# Patient Record
Sex: Male | Born: 2000 | Race: White | Hispanic: No | Marital: Single | Smoking: Never smoker
Health system: Southern US, Community
[De-identification: ages and names within clinical notes are randomized; demographics above are authoritative.]

## PROBLEM LIST (undated history)

## (undated) DIAGNOSIS — J45909 Unspecified asthma, uncomplicated: Secondary | ICD-10-CM

---
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
## 2021-04-14 ENCOUNTER — Other Ambulatory Visit: Payer: Self-pay

## 2021-04-14 ENCOUNTER — Ambulatory Visit (INDEPENDENT_AMBULATORY_CARE_PROVIDER_SITE_OTHER): Payer: Medicaid Other

## 2021-04-14 ENCOUNTER — Ambulatory Visit
Admission: EM | Admit: 2021-04-14 | Discharge: 2021-04-14 | Disposition: A | Discharge status: Home / Self Care | Payer: Medicaid Other | Attending: Physician Assistant | Admitting: Physician Assistant

## 2021-04-14 DIAGNOSIS — R079 Chest pain, unspecified: Secondary | ICD-10-CM | POA: Diagnosis not present

## 2021-04-14 DIAGNOSIS — R0789 Other chest pain: Secondary | ICD-10-CM | POA: Diagnosis not present

## 2021-04-14 DIAGNOSIS — J45909 Unspecified asthma, uncomplicated: Secondary | ICD-10-CM

## 2021-04-14 DIAGNOSIS — R0602 Shortness of breath: Secondary | ICD-10-CM

## 2021-04-14 DIAGNOSIS — F419 Anxiety disorder, unspecified: Secondary | ICD-10-CM

## 2021-04-14 MED ORDER — HYDROXYZINE HCL 25 MG PO TABS: 25.0000 mg | ORAL_TABLET | Freq: Four times a day (QID) | ORAL | 1 refills | Status: AC | PRN

## 2021-04-14 MED ORDER — ALBUTEROL SULFATE HFA 108 (90 BASE) MCG/ACT IN AERS: 1.0000 | INHALATION_SPRAY | Freq: Four times a day (QID) | RESPIRATORY_TRACT | 0 refills | Status: DC | PRN

## 2021-04-14 NOTE — ED Provider Notes (Signed)
[facility]    CSN: [id number] Arrival date & time: 04/14/21  0930      History   Chief Complaint Chief Complaint  Patient presents with   Panic Attack    HPI [place] T Nordmeyer is a 20 y.o. male presenting for approximately 1 to 42-month history of intermittent but nearly daily chest tightness and feeling short of breath and like he cannot obtain a good breath.  Patient admits to increased anxiety and stress in his life.  He states that he does not sleep well.  He states that he believes he had a panic attack last night.  He says that he was just sitting down at home with his grandmother relaxing and suddenly felt like his chest was tight and he could not breathe.  He says that he currently feels like his chest is little tight, he cannot obtain a good breath.  Patient states he occasionally wheezes.  He believes he might have asthma but says he has not really been diagnosed with asthma.  He says he did try his grandmothers asthma inhaler but did not seem to help.  Patient does smoke marijuana.  He states that sometimes after he smokes it feels like his throat closes.  Patient says he believes he does have anxiety but has not been formally diagnosed.  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  He says that he is doing well in school.Marland Kitchen  He says he has his own apartment in [place] but he does get lonely.  He says that he has come to [place] to visit his grandmother.  Patient says he does not have a relationship with either of his parents though they live in the area.  He states that there is a long history of mental health problems in his family.  Patient fears that he may also have an undiagnosed mental health problem and need to take medication.  He says that he has had Xanax in the past but it seemed to make him feel worse.  Does admit to feeling like he might be depressed as well.  He has issues with sleep.  He denies any suicidal  ideation.  He has no other concerns.  HPI  Past Medical History:  Diagnosis Date   Asthma     There are no problems to display for this patient.   History reviewed. No pertinent surgical history.     Home Medications    Prior to Admission medications   Medication Sig Start Date End Date Taking? Authorizing Provider  albuterol (VENTOLIN HFA) 108 (90 Base) MCG/ACT inhaler Inhale 1-2 puffs into the lungs every 6 (six) hours as needed for wheezing or shortness of breath. 04/14/21 04/14/22 Yes Eusebio Friendly B, PA-C  hydrOXYzine (ATARAX/VISTARIL) 25 MG tablet Take 1 tablet (25 mg total) by mouth every 6 (six) hours as needed for anxiety. 04/14/21 05/14/21 Yes Eusebio Friendly B, PA-C  bismuth subsalicylate (PEPTO-BISMOL) 262 MG/15ML suspension Take 30 mLs by mouth 3 (three) times daily. With Zantac 150 mg 3 times a day for the next 3-5 days 10/21/15   Hassan Rowan, MD  diphenoxylate-atropine (LOMOTIL) 2.5-0.025 MG tablet Take 1 tablet by mouth 4 (four) times daily as needed for diarrhea or loose stools. 10/21/15   Hassan Rowan, MD  doxycycline (VIBRA-TABS) 100 MG tablet Take 1 tablet (100 mg total) 2 (two) times daily by mouth. 09/03/17   Payton Mccallum, MD  ondansetron (ZOFRAN ODT) 8 MG disintegrating  tablet Take 1 tablet (8 mg total) by mouth every 8 (eight) hours as needed for nausea or vomiting. 10/21/15   Hassan Rowan, MD  ranitidine (ZANTAC) 150 MG capsule Take 1 capsule (150 mg total) by mouth 3 (three) times daily. With Pepto-Bismol for the next 3-5 days 10/21/15   Hassan Rowan, MD    Family History No family history on file.  Social History Social History   Tobacco Use   Smoking status: Never   Smokeless tobacco: Never  Substance Use Topics   Alcohol use: No   Drug use: Yes    Types: Marijuana     Allergies   Patient has no known allergies.   Review of Systems Review of Systems  Constitutional:  Negative for fatigue.  HENT:  Negative for congestion.   Respiratory:   Positive for chest tightness, shortness of breath and wheezing. Negative for cough.   Cardiovascular:  Negative for palpitations.  Gastrointestinal:  Negative for abdominal pain and vomiting.  Neurological:  Negative for dizziness and headaches.  Psychiatric/Behavioral:  Positive for sleep disturbance. Negative for suicidal ideas. The patient is nervous/anxious.     Physical Exam Triage Vital Signs ED Triage Vitals  Enc Vitals Group     BP 04/14/21 0936 (!) 149/83     Pulse Rate 04/14/21 0936 93     Resp 04/14/21 0936 16     Temp 04/14/21 0936 98 F (36.7 C)     Temp Source 04/14/21 0936 Oral     SpO2 04/14/21 0936 100 %     Weight 04/14/21 0937 245 lb (111.1 kg)     Height 04/14/21 0937 5\' 7"  (1.702 m)     Head Circumference --      Peak Flow --      Pain Score 04/14/21 0936 0     Pain Loc --      Pain Edu? --      Excl. in [place]? --    No data found.  Updated Vital Signs BP 127/76   Pulse 73   Temp 98 F (36.7 C) (Oral)   Resp 16   Ht 5\' 7"  (1.702 m)   Wt 245 lb (111.1 kg)   SpO2 100%   BMI 38.37 kg/m       Physical Exam Vitals and nursing note reviewed.  Constitutional:      General: He is not in acute distress.    Appearance: Normal appearance. He is well-developed. He is obese. He is not ill-appearing.  HENT:     Head: Normocephalic and atraumatic.  Eyes:     General: No scleral icterus.    Conjunctiva/sclera: Conjunctivae normal.  Cardiovascular:     Rate and Rhythm: Normal rate and regular rhythm.     Heart sounds: Normal heart sounds.  Pulmonary:     Effort: Pulmonary effort is normal. No respiratory distress.     Breath sounds: Wheezing (few scattered wheezes) present. No rhonchi or rales.  Musculoskeletal:     Cervical back: Neck supple.  Skin:    General: Skin is warm and dry.  Neurological:     General: No focal deficit present.     Mental Status: He is alert and oriented to person, place, and time. Mental status is at baseline.     Motor: No  weakness.     Gait: Gait normal.  Psychiatric:        Mood and Affect: Mood is anxious.        Behavior: Behavior normal.  Thought Content: Thought content normal.     UC Treatments / Results  Labs (all labs ordered are listed, but only abnormal results are displayed) Labs Reviewed - No data to display  EKG   Radiology DG Chest 2 View  Result Date: 04/14/2021 CLINICAL DATA:  Shortness of breath with chest tightness EXAM: CHEST - 2 VIEW COMPARISON:  None FINDINGS: Lungs are clear. Heart size and pulmonary vascularity are normal. No adenopathy. No bone lesions. No pneumothorax. IMPRESSION: Lungs clear.  Cardiac silhouette normal. Electronically Signed   By: Bretta BangWilliam  Woodruff III M.D.   On: 04/14/2021 10:30    Procedures ED EKG  Date/Time: 04/14/2021 10:27 AM Performed by: Shirlee LatchEaves, Tiannah Greenly B, PA-C Authorized by: Shirlee LatchEaves, Kamar Callender B, PA-C   ECG reviewed by ED Physician in the absence of a cardiologist: yes   Previous ECG:    Previous ECG:  Unavailable Interpretation:    Interpretation: normal   Rate:    ECG rate:  72   ECG rate assessment: normal   Rhythm:    Rhythm: sinus rhythm   Ectopy:    Ectopy: none   QRS:    QRS axis:  Normal   QRS intervals:  Normal   QRS conduction: normal   ST segments:    ST segments:  Normal T waves:    T waves: normal   Comments:     Normal sinus rhythm with sinus arrhythmia (including critical care time)  Medications Ordered in UC Medications - No data to display  Initial Impression / Assessment and Plan / UC Course  I have reviewed the triage vital signs and the nursing notes.  Pertinent labs & imaging results that were available during my care of the patient were reviewed by me and considered in my medical decision making (see chart for details).  20 year old male presenting for concerns about anxiety/panic attack.  He says that over the past couple of months he has had intermittent chest tightness and feeling short of breath  admits to some racing thoughts and difficulty sleeping at night.  Patient does report that he does not have a good relationship with his parents or much of his family.  He is also attending school in [place] and living on his own and admits to feeling lonely.  Strong family history of mental health conditions.  Vital signs are all normal and stable.  He is overall well-appearing.  He does seem somewhat anxious and repeatedly states "I don't know" to a lot of my questions.  Auscultation of heart reveals regular rate and rhythm.  There are a few scattered wheezes throughout chest.  No respiratory distress.  Ordered EKG and chest x-ray.  EKG read by me shows normal sinus rhythm with sinus arrhythmia.  Chest x-ray independently viewed by me is normal.  Overread normal  Advised patient that he may have some underlying asthma or reactive airway disease.  Prescribed inhaler for him to use if he feels short of breath and encouraged him to try to stop smoking marijuana.  Advised that this is likely something that helps his anxiety and stress and if he does stop that and he definitely needs to find another outlet.  Suggested that he talk to the student health center at [employer].  There are many resources there.  He may need to be on an SSRI for anxiety and depression and speak with a therapist.  I have also provided him with contact information for [facility] in [place] since he says that he does  visit his grandmother often and could return to the area.  At this time, advised that he can try the hydroxyzine if he feels like he is having a panic attack.  Advised to go to ED for any acute or severe symptoms such as panic attacks that is not getting better, shortness of breath not resolved by rest and the albuterol inhaler, worsening chest pain, feeling faint or passing out, or if he had any suicidal thoughts or plans of self-harm.  Patient agreeable to plan.  > 60 min spent in direct patient  care  Final Clinical Impressions(s) / UC Diagnoses   Final diagnoses:  Anxiety  Feeling of chest tightness  Shortness of breath  Reactive airway disease without complication, unspecified asthma severity, unspecified whether persistent     Discharge Instructions      -EKG is normal -Chest xray is also normal -Vital signs are normal -Mild wheezing on exam so you probably do have underlying asthma or reactive airway disease. Use albuterol and try to stop smoking. Follow up with PCP. If you don't have one, the [facility] health center has many resources and you should be able to easily make an appointment online. This also goes for making appointment about your anxiety. We can try hydroxyzine at this time if you feel you are having a panic attack, but you may needs something like Zoloft or Prozac to help the anxiety and depression. This is something you take everyday. Additionally you would benefit from speaking to a therapist. Your health center on campus should have information about this as well -I have included contact info for [facility]. You can call them for appointment as well -Try Calm or Headspace apps -Please go to ED for worsening chest pain or breathing, feeling weak or passing out, or if you have thoughts of harming yourself or someone else.     ED Prescriptions     Medication Sig Dispense Auth. Provider   hydrOXYzine (ATARAX/VISTARIL) 25 MG tablet Take 1 tablet (25 mg total) by mouth every 6 (six) hours as needed for anxiety. 60 tablet Eusebio Friendly B, PA-C   albuterol (VENTOLIN HFA) 108 (90 Base) MCG/ACT inhaler Inhale 1-2 puffs into the lungs every 6 (six) hours as needed for wheezing or shortness of breath. 1 g Shirlee Latch, PA-C      PDMP not reviewed this encounter.   Shirlee Latch, PA-C 04/14/21 1051

## 2021-04-14 NOTE — ED Triage Notes (Signed)
Pt sts he had a panic attack last night. Sts when he woke up this morning he felt SOB.  NAD in triage.

## 2021-04-14 NOTE — Discharge Instructions (Addendum)
-  EKG is normal -Chest xray is also normal -Vital signs are normal -Mild wheezing on exam so you probably do have underlying asthma or reactive airway disease. Use albuterol and try to stop smoking. Follow up with PCP. If you don't have one, the [facility] center has many resources and you should be able to easily make an appointment online. This also goes for making appointment about your anxiety. We can try hydroxyzine at this time if you feel you are having a panic attack, but you may needs something like Zoloft or Prozac to help the anxiety and depression. This is something you take everyday. Additionally you would benefit from speaking to a therapist. Your health center on campus should have information about this as well -I have included contact info for [facility]. You can call them for appointment as well -Try Calm or Headspace apps -Please go to ED for worsening chest pain or breathing, feeling weak or passing out, or if you have thoughts of harming yourself or someone else.

## 2021-04-18 ENCOUNTER — Telehealth: Payer: Self-pay

## 2021-05-27 ENCOUNTER — Ambulatory Visit
Admission: EM | Admit: 2021-05-27 | Discharge: 2021-05-27 | Disposition: A | Discharge status: Home / Self Care | Payer: Medicaid Other | Attending: Family Medicine | Admitting: Family Medicine

## 2021-05-27 ENCOUNTER — Telehealth: Payer: Self-pay

## 2021-05-27 ENCOUNTER — Other Ambulatory Visit: Payer: Self-pay

## 2021-05-27 DIAGNOSIS — U071 COVID-19: Secondary | ICD-10-CM | POA: Insufficient documentation

## 2021-05-27 LAB — BASIC METABOLIC PANEL
Anion gap: 11 (ref 5–15)
BUN: 12 mg/dL (ref 6–20)
CO2: 22 mmol/L (ref 22–32)
Calcium: 9.5 mg/dL (ref 8.9–10.3)
Chloride: 101 mmol/L (ref 98–111)
Creatinine, Ser: 1.01 mg/dL (ref 0.61–1.24)
GFR, Estimated: >60 mL/min (ref >60)
Glucose, Bld: 93 mg/dL (ref 70–99)
Potassium: 3.5 mmol/L (ref 3.5–5.1)
Sodium: 134 mmol/L — ABNORMAL LOW (ref 135–145)

## 2021-05-27 LAB — RESP PANEL BY RT-PCR (FLU A&B, COVID) ARPGX2
Influenza A by PCR: NEGATIVE
Influenza B by PCR: NEGATIVE
SARS Coronavirus 2 by RT PCR: POSITIVE — AB

## 2021-05-27 MED ORDER — ONDANSETRON 8 MG PO TBDP: 8.0000 mg | ORAL_TABLET | Freq: Three times a day (TID) | ORAL | 0 refills | Status: AC | PRN

## 2021-05-27 MED ORDER — PROMETHAZINE-DM 6.25-15 MG/5ML PO SYRP: 5.0000 mL | ORAL_SOLUTION | Freq: Four times a day (QID) | ORAL | 0 refills | Status: AC | PRN

## 2021-05-27 MED ORDER — IPRATROPIUM BROMIDE 0.06 % NA SOLN: 2.0000 | Freq: Four times a day (QID) | NASAL | 12 refills | Status: AC

## 2021-05-27 MED ORDER — ONDANSETRON 8 MG PO TBDP: 8.0000 mg | ORAL_TABLET | Freq: Three times a day (TID) | ORAL | 0 refills | Status: DC | PRN

## 2021-05-27 MED ORDER — BENZONATATE 100 MG PO CAPS: 200.0000 mg | ORAL_CAPSULE | Freq: Three times a day (TID) | ORAL | 0 refills | Status: AC

## 2021-05-27 MED ORDER — NIRMATRELVIR/RITONAVIR (PAXLOVID)TABLET: 3.0000 | ORAL_TABLET | Freq: Two times a day (BID) | ORAL | 0 refills | Status: AC

## 2021-05-27 MED ORDER — ALBUTEROL SULFATE HFA 108 (90 BASE) MCG/ACT IN AERS: 1.0000 | INHALATION_SPRAY | Freq: Four times a day (QID) | RESPIRATORY_TRACT | 1 refills | Status: AC | PRN

## 2021-05-27 NOTE — ED Triage Notes (Signed)
Patient states that he has been having shortness of breath, cough, vomiting, weakness. States that his father tested positive for covid. Patients symptoms started 12 hours ago.

## 2021-05-27 NOTE — ED Provider Notes (Signed)
[facility]    CSN: [id number] Arrival date & time: 05/27/21  0569      History   Chief Complaint Chief Complaint  Patient presents with   Shortness of Breath    HPI Joshua Newton is a 20 y.o. male.   HPI  20 year old male here for evaluation of respiratory complaints.  Patient reports that 12 hours ago he developed runny nose and nasal congestion, burning sore throat, nausea and vomiting for 12 episodes of emesis, diarrhea, shortness of breath and wheezing, abdominal cramping, and body aches.  He denies any pain in his ears.  Patient's father did test positive for COVID.  Patient reports that he has asthma and that he also smokes marijuana.  He does have a rescue inhaler which he had to use once last night.   Past Medical History:  Diagnosis Date   Asthma     There are no problems to display for this patient.   History reviewed. No pertinent surgical history.     Home Medications    Prior to Admission medications   Medication Sig Start Date End Date Taking? Authorizing Provider  benzonatate (TESSALON) 100 MG capsule Take 2 capsules (200 mg total) by mouth every 8 (eight) hours. 05/27/21  Yes Margarette Canada, NP  ipratropium (ATROVENT) 0.06 % nasal spray Place 2 sprays into both nostrils 4 (four) times daily. 05/27/21  Yes Margarette Canada, NP  nirmatrelvir/ritonavir EUA (PAXLOVID) TABS Take 3 tablets by mouth 2 (two) times daily for 5 days. Patient GFR is >60. Take nirmatrelvir (150 mg) two tablets twice daily for 5 days and ritonavir (100 mg) one tablet twice daily for 5 days. 05/27/21 06/01/21 Yes Margarette Canada, NP  promethazine-dextromethorphan (PROMETHAZINE-DM) 6.25-15 MG/5ML syrup Take 5 mLs by mouth 4 (four) times daily as needed. 05/27/21  Yes Margarette Canada, NP  albuterol (VENTOLIN HFA) 108 (90 Base) MCG/ACT inhaler Inhale 1-2 puffs into the lungs every 6 (six) hours as needed for wheezing or shortness of breath. 05/27/21 05/27/22  Margarette Canada, NP  ondansetron  (ZOFRAN ODT) 8 MG disintegrating tablet Take 1 tablet (8 mg total) by mouth every 8 (eight) hours as needed for nausea or vomiting. 05/27/21   Margarette Canada, NP    Family History History reviewed. No pertinent family history.  Social History Social History   Tobacco Use   Smoking status: Never   Smokeless tobacco: Never  Substance Use Topics   Alcohol use: No   Drug use: Yes    Types: Marijuana     Allergies   Patient has no known allergies.   Review of Systems Review of Systems  Constitutional:  Positive for fatigue and fever. Negative for activity change and appetite change.  HENT:  Positive for congestion, rhinorrhea and sore throat. Negative for ear pain.   Respiratory:  Positive for cough, shortness of breath and wheezing.   Gastrointestinal:  Positive for abdominal pain, diarrhea, nausea and vomiting.  Musculoskeletal:  Positive for arthralgias and myalgias.  Skin:  Negative for rash.  Hematological: Negative.   Psychiatric/Behavioral: Negative.      Physical Exam Triage Vital Signs ED Triage Vitals  Enc Vitals Group     BP 05/27/21 0833 (!) 143/85     Pulse Rate 05/27/21 0833 (!) 112     Resp 05/27/21 0833 18     Temp 05/27/21 0833 (!) 100.8 F (38.2 C)     Temp Source 05/27/21 0833 Oral     SpO2 05/27/21 0833 98 %  Weight 05/27/21 0831 240 lb (108.9 kg)     Height 05/27/21 0831 _0  (1.702 m)     Head Circumference --      Peak Flow --      Pain Score 05/27/21 0831 4     Pain Loc --      Pain Edu? --      Excl. in [place]? --    No data found.  Updated Vital Signs BP (!) 143/85 (BP Location: Right Arm)   Pulse (!) 112   Temp (!) 100.8 F (38.2 C) (Oral)   Resp 18   Ht _1  (1.702 m)   Wt 240 lb (108.9 kg)   SpO2 98%   BMI 37.59 kg/m   Visual Acuity Right Eye Distance:   Left Eye Distance:   Bilateral Distance:    Right Eye Near:   Left Eye Near:    Bilateral Near:     Physical Exam Vitals and nursing note reviewed.  Constitutional:       General: He is not in acute distress.    Appearance: Normal appearance. He is obese. He is not ill-appearing.  HENT:     Head: Normocephalic and atraumatic.     Right Ear: Tympanic membrane, ear canal and external ear normal. There is no impacted cerumen.     Left Ear: Tympanic membrane, ear canal and external ear normal. There is no impacted cerumen.     Nose: Congestion and rhinorrhea present.     Mouth/Throat:     Mouth: Mucous membranes are moist.     Pharynx: Oropharynx is clear. Posterior oropharyngeal erythema present.  Cardiovascular:     Rate and Rhythm: Normal rate and regular rhythm.     Pulses: Normal pulses.     Heart sounds: Normal heart sounds. No murmur heard.   No gallop.  Pulmonary:     Effort: Pulmonary effort is normal.     Breath sounds: Normal breath sounds. No wheezing, rhonchi or rales.  Abdominal:     Palpations: Abdomen is soft.     Tenderness: There is no abdominal tenderness.  Musculoskeletal:     Cervical back: Normal range of motion and neck supple.  Lymphadenopathy:     Cervical: No cervical adenopathy.  Skin:    General: Skin is warm and dry.     Capillary Refill: Capillary refill takes less than 2 seconds.     Findings: No erythema or rash.  Neurological:     General: No focal deficit present.     Mental Status: He is alert and oriented to person, place, and time.  Psychiatric:        Mood and Affect: Mood normal.        Behavior: Behavior normal.        Thought Content: Thought content normal.        Judgment: Judgment normal.     UC Treatments / Results  Labs (all labs ordered are listed, but only abnormal results are displayed) Labs Reviewed  RESP PANEL BY RT-PCR (FLU A&B, COVID) ARPGX2 - Abnormal; Notable for the following components:      Result Value   SARS Coronavirus 2 by RT PCR POSITIVE (*)    All other components within normal limits  BASIC METABOLIC PANEL - Abnormal; Notable for the following components:   Sodium 134  (*)    All other components within normal limits    EKG   Radiology No results found.  Procedures Procedures (including critical care time)  Medications Ordered in UC Medications - No data to display  Initial Impression / Assessment and Plan / UC Course  I have reviewed the triage vital signs and the nursing notes.  Pertinent labs & imaging results that were available during my care of the patient were reviewed by me and considered in my medical decision making (see chart for details).  Patient is a nontoxic-appearing 20 year old male here for evaluation following COVID exposure COVID-like symptoms.  He is complaining of upper respiratory symptoms with runny nose nasal congestion, burning in his throat he believes is secondary to his vomiting, and lower respiratory symptoms as well.  Patient is complaining of nonproductive cough, shortness of breath and wheezing.  Patient is a smoker and he also has asthma.  Patient is not tachypneic nor hypoxic.  He is febrile in the clinic with a temperature of 100.8.  Patient's physical exam reveals tympanic membrane's bilaterally with a normal light reflex and clear external auditory canals.  Nasal mucosa is erythematous and edematous with out nasal discharge.  Oropharyngeal exam reveals mild posterior oropharyngeal erythema.  No cervical adenopathy appreciated exam.  Cardiopulmonary exam reveals clear lung sounds in all fields.  Patient swabbed for COVID and flu with a respiratory triplex panel at triage.  Awaiting results.  Respiratory triplex panel is positive for COVID.  Will draw been met to check GFR to see if it is within parameters to prescribe Paxlovid.  BMP shows a GFR greater than 60.  Will discharge patient home on Paxlovid, Atrovent nasal spray, Tessalon Perles, and Promethazine DM cough syrup.  ER precautions and quarantine instructions reviewed with patient.  I will also renew the patient's prescription for albuterol and for Zofran as he  has had nausea and vomiting associated with his symptoms.   Final Clinical Impressions(s) / UC Diagnoses   Final diagnoses:  [id number]     Discharge Instructions      You will have to quarantine for 5 days from the start of your symptoms.  After 5 days you can break quarantine if your symptoms have improved and you have not had a fever for 24 hours without taking Tylenol or ibuprofen.  Use over-the-counter Tylenol and ibuprofen as needed for body aches and fever.  Take the Paxlovid twice daily for 5 days for treatment of COVID-19.  Use your albuterol inhaler, 2 puffs every 4-6 hours, as needed for shortness of breath and wheezing.  Use the Tessalon Perles during the day as needed for cough and the Promethazine DM cough syrup at nighttime as will make you drowsy.  Use the Zofran every 8 hours as needed for nausea and vomiting.  If you develop any increased shortness of breath-especially at rest, you are unable to speak in full sentences, or is a late sign your lips are turning blue you need to go the ER for evaluation.      ED Prescriptions     Medication Sig Dispense Auth. Provider   albuterol (VENTOLIN HFA) 108 (90 Base) MCG/ACT inhaler Inhale 1-2 puffs into the lungs every 6 (six) hours as needed for wheezing or shortness of breath. 18 g Margarette Canada, NP   ondansetron (ZOFRAN ODT) 8 MG disintegrating tablet Take 1 tablet (8 mg total) by mouth every 8 (eight) hours as needed for nausea or vomiting. 20 tablet Margarette Canada, NP   benzonatate (TESSALON) 100 MG capsule Take 2 capsules (200 mg total) by mouth every 8 (eight) hours. 21 capsule Margarette Canada, NP   ipratropium (ATROVENT) 0.06 %  nasal spray Place 2 sprays into both nostrils 4 (four) times daily. 15 mL Margarette Canada, NP   nirmatrelvir/ritonavir EUA (PAXLOVID) TABS Take 3 tablets by mouth 2 (two) times daily for 5 days. Patient GFR is >60. Take nirmatrelvir (150 mg) two tablets twice daily for 5 days and ritonavir (100 mg) one  tablet twice daily for 5 days. 30 tablet Margarette Canada, NP   promethazine-dextromethorphan (PROMETHAZINE-DM) 6.25-15 MG/5ML syrup Take 5 mLs by mouth 4 (four) times daily as needed. 118 mL Margarette Canada, NP      PDMP not reviewed this encounter.   Margarette Canada, NP 05/27/21 1035

## 2021-05-27 NOTE — Discharge Instructions (Addendum)
You will have to quarantine for 5 days from the start of your symptoms.  After 5 days you can break quarantine if your symptoms have improved and you have not had a fever for 24 hours without taking Tylenol or ibuprofen.  Use over-the-counter Tylenol and ibuprofen as needed for body aches and fever.  Take the Paxlovid twice daily for 5 days for treatment of COVID-19.  Use your albuterol inhaler, 2 puffs every 4-6 hours, as needed for shortness of breath and wheezing.  Use the Tessalon Perles during the day as needed for cough and the Promethazine DM cough syrup at nighttime as will make you drowsy.  Use the Zofran every 8 hours as needed for nausea and vomiting.  If you develop any increased shortness of breath-especially at rest, you are unable to speak in full sentences, or is a late sign your lips are turning blue you need to go the ER for evaluation.

## 2021-07-28 NOTE — Telephone Encounter (Signed)
CLOSED

## 2022-03-07 ENCOUNTER — Ambulatory Visit (LOCAL_COMMUNITY_HEALTH_CENTER): Payer: Self-pay

## 2022-03-07 DIAGNOSIS — Z111 Encounter for screening for respiratory tuberculosis: Secondary | ICD-10-CM

## 2022-03-10 ENCOUNTER — Ambulatory Visit (LOCAL_COMMUNITY_HEALTH_CENTER): Payer: Self-pay

## 2022-03-10 DIAGNOSIS — Z111 Encounter for screening for respiratory tuberculosis: Secondary | ICD-10-CM

## 2022-03-10 LAB — TB SKIN TEST
Induration: 0 mm
TB Skin Test: NEGATIVE

## 2022-10-24 ENCOUNTER — Ambulatory Visit: Payer: Medicaid Other

## 2023-01-20 IMAGING — CR DG CHEST 2V
2 series · 2 of 2 positions shown · non-contrast
Comparison: None

CLINICAL DATA: Shortness of breath with chest tightness

EXAM:
CHEST - 2 VIEW

[chest pa]
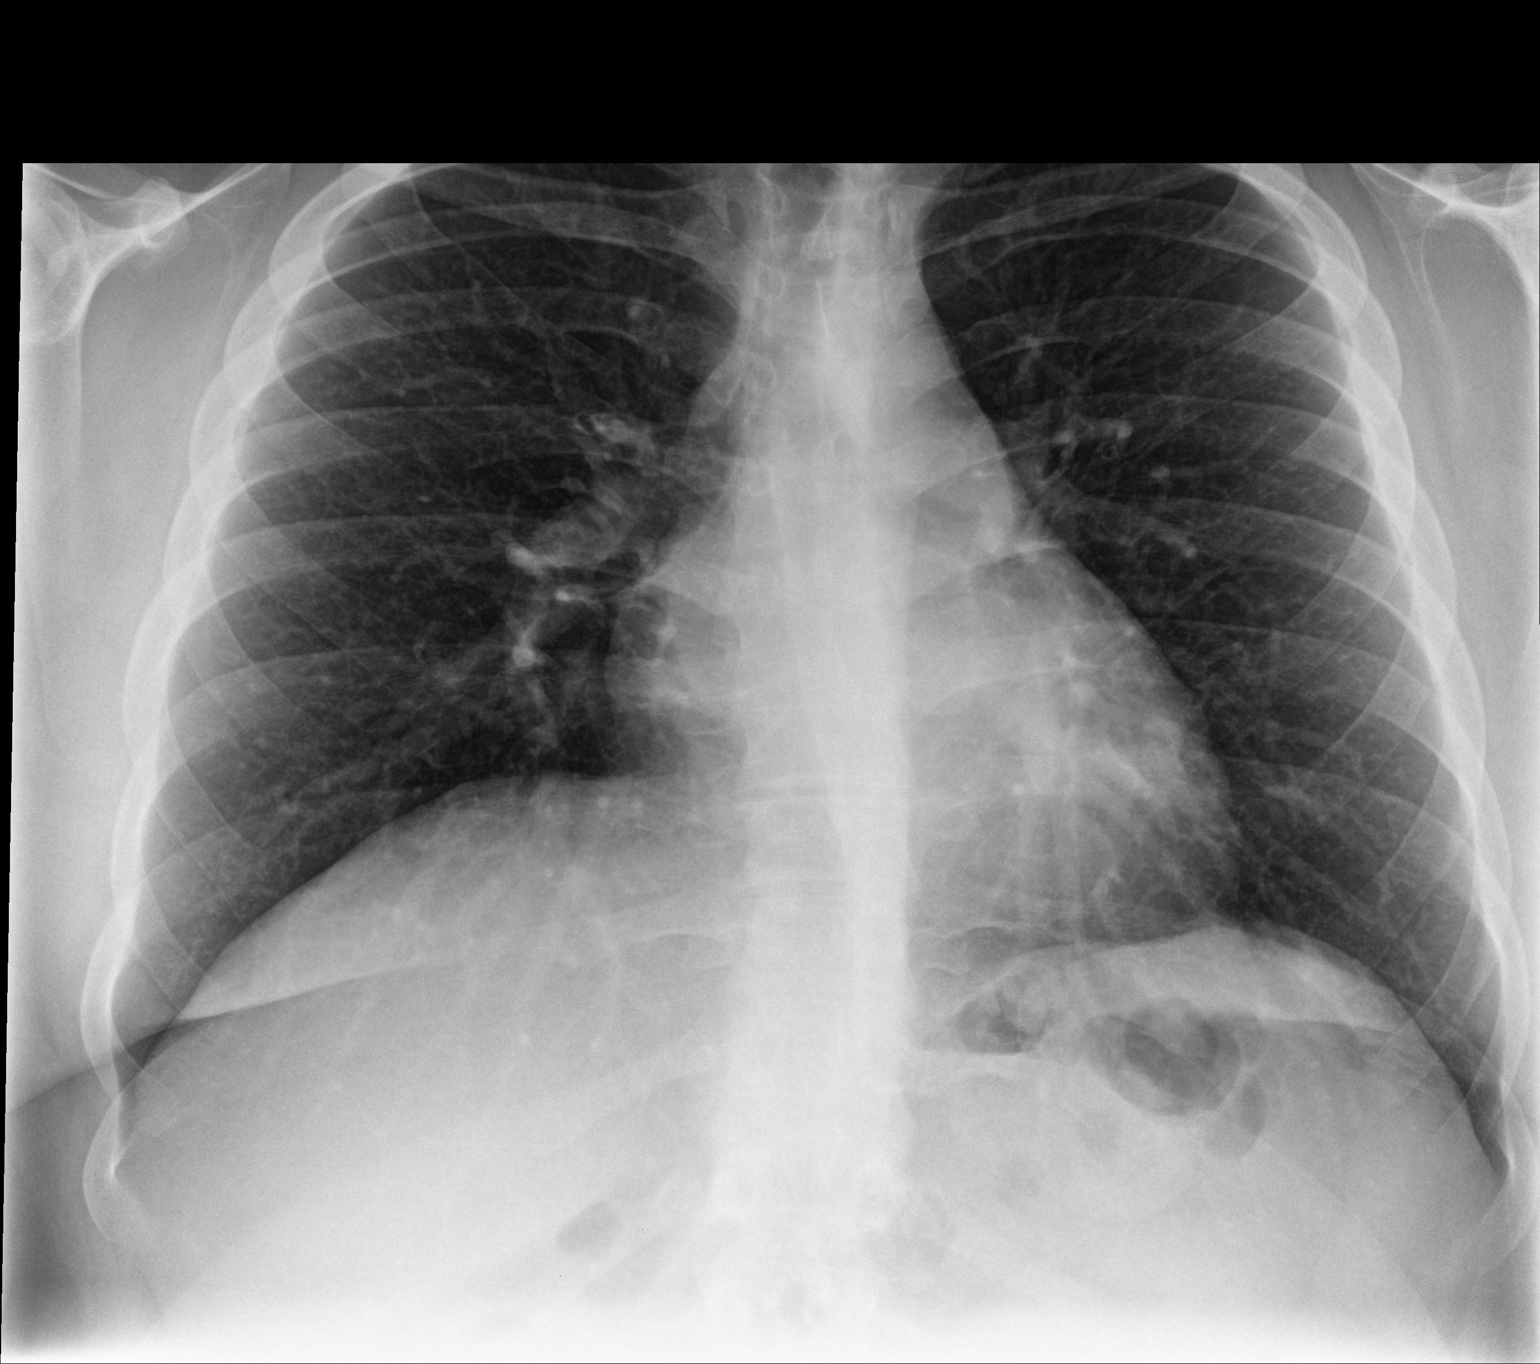

[chest lat]
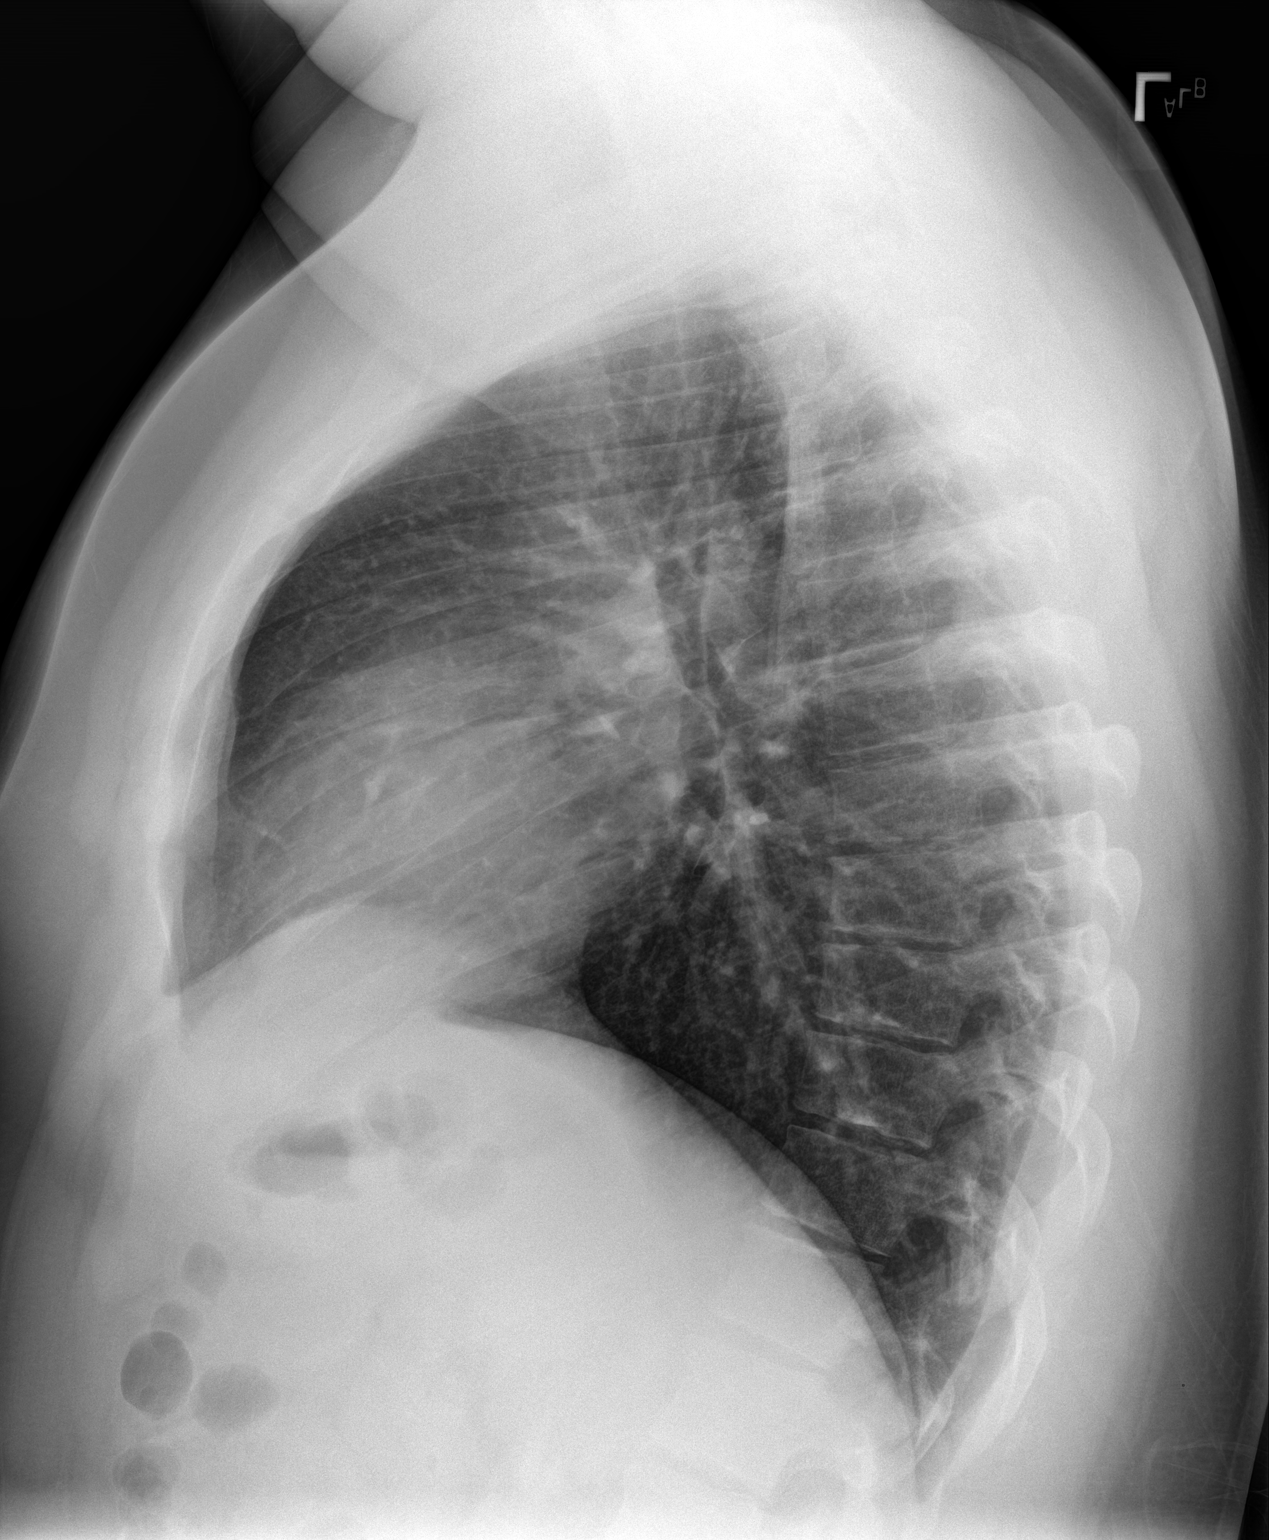

[2 of 2 positions shown; findings below may reference images not displayed]

FINDINGS: Lungs are clear. Heart size and pulmonary vascularity are normal. No
adenopathy. No bone lesions. No pneumothorax.
IMPRESSION: Lungs clear.  Cardiac silhouette normal.
